# Patient Record
Sex: Male | Born: 1955
Health system: Southern US, Community
[De-identification: ages and names within clinical notes are randomized; demographics above are authoritative.]

## PROBLEM LIST (undated history)

## (undated) DIAGNOSIS — I10 Essential (primary) hypertension: Secondary | ICD-10-CM

## (undated) DIAGNOSIS — R51 Headache: Secondary | ICD-10-CM

## (undated) DIAGNOSIS — E7800 Pure hypercholesterolemia, unspecified: Secondary | ICD-10-CM

## (undated) DIAGNOSIS — E785 Hyperlipidemia, unspecified: Secondary | ICD-10-CM

## (undated) HISTORY — DX: Hyperlipidemia, unspecified: E78.5

## (undated) HISTORY — PX: COLONOSCOPY: SHX174

## (undated) HISTORY — DX: Essential (primary) hypertension: I10

---

## 2003-06-24 ENCOUNTER — Ambulatory Visit (HOSPITAL_COMMUNITY): Admission: RE | Admit: 2003-06-24 | Discharge: 2003-06-24 | Payer: Self-pay | Admitting: Family Medicine

## 2006-01-18 ENCOUNTER — Ambulatory Visit (HOSPITAL_COMMUNITY): Admission: RE | Admit: 2006-01-18 | Discharge: 2006-01-18 | Payer: Self-pay | Admitting: Family Medicine

## 2006-02-10 ENCOUNTER — Encounter (INDEPENDENT_AMBULATORY_CARE_PROVIDER_SITE_OTHER): Payer: Self-pay | Admitting: Specialist

## 2006-02-10 ENCOUNTER — Ambulatory Visit: Payer: Self-pay | Admitting: Internal Medicine

## 2006-02-10 ENCOUNTER — Ambulatory Visit (HOSPITAL_COMMUNITY): Admission: RE | Admit: 2006-02-10 | Discharge: 2006-02-10 | Payer: Self-pay | Admitting: Internal Medicine

## 2007-01-31 ENCOUNTER — Ambulatory Visit (HOSPITAL_COMMUNITY): Admission: RE | Admit: 2007-01-31 | Discharge: 2007-01-31 | Payer: Self-pay | Admitting: Family Medicine

## 2008-03-14 ENCOUNTER — Ambulatory Visit (HOSPITAL_COMMUNITY): Admission: RE | Admit: 2008-03-14 | Discharge: 2008-03-14 | Payer: Self-pay | Admitting: Family Medicine

## 2008-12-26 ENCOUNTER — Emergency Department (HOSPITAL_COMMUNITY): Admission: EM | Admit: 2008-12-26 | Discharge: 2008-12-26 | Payer: Self-pay | Admitting: Emergency Medicine

## 2009-07-21 ENCOUNTER — Ambulatory Visit (HOSPITAL_COMMUNITY): Admission: RE | Admit: 2009-07-21 | Discharge: 2009-07-21 | Payer: Self-pay | Admitting: Family Medicine

## 2010-08-17 ENCOUNTER — Ambulatory Visit (HOSPITAL_COMMUNITY)
Admission: RE | Admit: 2010-08-17 | Discharge: 2010-08-17 | Disposition: A | Discharge status: Home / Self Care | Payer: BC Managed Care – PPO | Source: Ambulatory Visit | Attending: Family Medicine | Admitting: Family Medicine

## 2010-08-17 ENCOUNTER — Other Ambulatory Visit (HOSPITAL_COMMUNITY): Payer: Self-pay | Admitting: Family Medicine

## 2010-08-17 DIAGNOSIS — F17200 Nicotine dependence, unspecified, uncomplicated: Secondary | ICD-10-CM

## 2010-08-17 DIAGNOSIS — R05 Cough: Secondary | ICD-10-CM

## 2010-08-17 DIAGNOSIS — R059 Cough, unspecified: Secondary | ICD-10-CM | POA: Insufficient documentation

## 2010-09-12 LAB — BASIC METABOLIC PANEL
BUN: 17 mg/dL (ref 6–23)
CO2: 26 mEq/L (ref 19–32)
Calcium: 9.3 mg/dL (ref 8.4–10.5)
Chloride: 109 mEq/L (ref 96–112)
Creatinine, Ser: 0.95 mg/dL (ref 0.4–1.5)
GFR calc Af Amer: >60 mL/min (ref >60)
GFR calc non Af Amer: >60 mL/min (ref >60)
Glucose, Bld: 145 mg/dL — ABNORMAL HIGH (ref 70–99)
Potassium: 3.5 mEq/L (ref 3.5–5.1)
Sodium: 140 mEq/L (ref 135–145)

## 2010-09-12 LAB — CBC
HCT: 42.1 % (ref 39.0–52.0)
Hemoglobin: 15.1 g/dL (ref 13.0–17.0)
MCHC: 35.8 g/dL (ref 30.0–36.0)
MCV: 93.2 fL (ref 78.0–100.0)
Platelets: 190 10*3/uL (ref 150–400)
RBC: 4.52 MIL/uL (ref 4.22–5.81)
RDW: 12.7 % (ref 11.5–15.5)
WBC: 9.6 10*3/uL (ref 4.0–10.5)

## 2010-09-12 LAB — POCT CARDIAC MARKERS
CKMB, poc: <1 ng/mL — ABNORMAL LOW (ref 1.0–8.0)
CKMB, poc: <1 ng/mL — ABNORMAL LOW (ref 1.0–8.0)
Myoglobin, poc: 55.1 ng/mL (ref 12–200)
Myoglobin, poc: 66.4 ng/mL (ref 12–200)
Troponin i, poc: <0.05 ng/mL (ref 0.00–0.09)
Troponin i, poc: <0.05 ng/mL (ref 0.00–0.09)

## 2010-09-12 LAB — DIFFERENTIAL
Basophils Absolute: 0 10*3/uL (ref 0.0–0.1)
Basophils Relative: 0 % (ref 0–1)
Eosinophils Absolute: 0.3 10*3/uL (ref 0.0–0.7)
Eosinophils Relative: 3 % (ref 0–5)
Lymphocytes Relative: 24 % (ref 12–46)
Lymphs Abs: 2.3 10*3/uL (ref 0.7–4.0)
Monocytes Absolute: 0.5 10*3/uL (ref 0.1–1.0)
Monocytes Relative: 6 % (ref 3–12)
Neutro Abs: 6.5 10*3/uL (ref 1.7–7.7)
Neutrophils Relative %: 68 % (ref 43–77)

## 2010-10-22 NOTE — Op Note (Signed)
NAME:  Brad Jackson, Brad Jackson               ACCOUNT NO.:  [id number]   MEDICAL RECORD NO.:  [id number]          PATIENT TYPE:  AMB   LOCATION:  DAY                           FACILITY:  [facility]   PHYSICIAN:  R. Roetta Sessions, M.D. DATE OF BIRTH:  [birth year]   DATE OF PROCEDURE:  02/10/2006  DATE OF DISCHARGE:                                 OPERATIVE REPORT   PROCEDURE:  Screening colonoscopy with biopsy.   INDICATIONS FOR PROCEDURE:  The patient is a 56 year old Caucasian male with  no lower GI tract symptoms sent over by the courtesy of Dr. Geanie Cooley for  colorectal cancer screening.  He has never had his lower GI tract imaged.  There is no family history of colorectal neoplasia.  Colonoscopy is now  being done as a screening maneuver.  This approach has been discussed with  the patient at length.  The potential risks, benefits, and alternatives have  been reviewed and questions answered.  He is agreeable.  Please see the  documentation in the medical record.   PROCEDURE:  O2 saturation, blood pressure, pulses, and respirations were  monitored throughout the entirety of the procedure.  Conscious sedation was  with Versed 4 mg IV, Demerol 75 mg IV in divided doses.  The instrument used  was the Olympus video chip system.   FINDINGS:  Digital rectal examination revealed no abnormalities.   ENDOSCOPIC FINDINGS:  The prep was excellent.   Rectum:  Examination of the rectal mucosa including retroflex view of the  anal verge revealed no abnormalities.   Colon:  The colonic mucosa was surveyed from the rectosigmoid junction  through the left, transverse, right colon to the area of the appendiceal  orifice, ileocecal valve, and cecum.  These structures were well-seen and  photographed for the record.  From this level, the scope was slowly  withdrawn.  All previously mentioned mucosal surfaces were again seen.  The  patient had a 4-mm polyp at 22 cm at the rectosigmoid.  The remainder of the  colonic mucosa appeared entirely normal.  The polyp was cold biopsy/removed.  The patient tolerated the procedure well and was reactive in endoscopy.   IMPRESSION:  1. Normal rectum.  2. Diminutive polyp at rectosigmoid, cold biopsy/removed.  3. The remainder of the colonic mucosa appeared normal.   RECOMMENDATIONS:  1. Follow up on pathology.  2. Further recommendations to follow.      [name], M.D.  Electronically Signed     RMR/MEDQ  D:  02/10/2006  T:  02/10/2006  Job:  [id number]   cc:   Corrie Mckusick, M.D.  Fax: [phone]

## 2011-07-12 ENCOUNTER — Other Ambulatory Visit (HOSPITAL_COMMUNITY): Payer: Self-pay | Admitting: Family Medicine

## 2011-07-12 ENCOUNTER — Ambulatory Visit (HOSPITAL_COMMUNITY)
Admission: RE | Admit: 2011-07-12 | Discharge: 2011-07-12 | Disposition: A | Discharge status: Home / Self Care | Payer: BC Managed Care – PPO | Source: Ambulatory Visit | Attending: Family Medicine | Admitting: Family Medicine

## 2011-07-12 DIAGNOSIS — Z0000 Encounter for general adult medical examination without abnormal findings: Secondary | ICD-10-CM

## 2011-07-12 DIAGNOSIS — E785 Hyperlipidemia, unspecified: Secondary | ICD-10-CM

## 2011-07-12 DIAGNOSIS — F17200 Nicotine dependence, unspecified, uncomplicated: Secondary | ICD-10-CM | POA: Insufficient documentation

## 2011-11-24 NOTE — H&P (Signed)
  NTS SOAP Note  Vital Signs:  Vitals as of: 11/24/2011: Systolic 159: Diastolic 89: Heart Rate 72: Temp 97.82F: Height 28ft 7in: Weight 172Lbs 0 Ounces: BMI 27  BMI : 26.94 kg/m2  Subjective: This 56 Years 55 Months old Male presents for an EGD.  Was admitted to [facility] several weeks ago with upper GI bleed.  Was taking both ASA and NSAIDS at that time.  Was treated for H. pylori positivity.  Has not had any episodes since that time.  Had a TCS within last ten years.  Denies any GI complaints at this time.   Review of Symptoms:  Constitutional:unremarkable   Head:unremarkable    Eyes:unremarkable   Nose/Mouth/Throat:unremarkable Cardiovascular:  unremarkable   Respiratory:unremarkable   Gastrointestinal:  unremarkable   Genitourinary:unremarkable     Musculoskeletal:unremarkable   Skin:unremarkable Hematolgic/Lymphatic:unremarkable     Allergic/Immunologic:unremarkable     Past Medical History:    Reviewed   Past Medical History  Surgical History: unremarkable Medical Problems: PUD, high cholesterol Allergies: nkda Medications: omeprazole, simvastatin, fish oil   Social History:Reviewed  Social History  Preferred Language: English ([place]) Race:  White Ethnicity: Not Hispanic / Latino Age: 56 Years 8 Months Marital Status:  S Alcohol: socially Recreational drug(s):  No   Smoking Status: Current every day smoker reviewed on 11/24/2011 Started Date: 06/06/1978 Packs per day: 1.00   Family History:  Reviewed   Family History  Is there a family history JX:[id number]    Objective Information: General:  Well appearing, well nourished in no distress. Throat:  no erythema, exudates or lesions. Neck:  Supple without lymphadenopathy.  Heart:  RRR, no murmur Lungs:    CTA bilaterally, no wheezes, rhonchi, rales.  Breathing unlabored. Abdomen:Soft, NT/ND, no HSM, no  masses.  Assessment:Peptic ulcer disease  Diagnosis &amp; Procedure: DiagnosisCode: 578.0, ProcedureCode: 08657,    Plan:Scheduled for EGD on 12/13/11.   Patient Education:Alternative treatments to surgery were discussed with patient (and family).  Risks and benefits  of procedure were fully explained to the patient (and family) who gave informed consent. Patient/family questions were addressed.  Follow-up:Pending Surgery

## 2011-12-07 ENCOUNTER — Encounter (HOSPITAL_COMMUNITY): Payer: Self-pay | Admitting: Pharmacy Technician

## 2011-12-19 MED ORDER — SODIUM CHLORIDE 0.45 % IV SOLN
Freq: Once | INTRAVENOUS | Status: AC
  Administered 2011-12-20: 07:00:00 via INTRAVENOUS

## 2011-12-20 ENCOUNTER — Ambulatory Visit (HOSPITAL_COMMUNITY)
Admission: RE | Admit: 2011-12-20 | Discharge: 2011-12-20 | Disposition: A | Discharge status: Home / Self Care | Payer: BC Managed Care – PPO | Source: Ambulatory Visit | Attending: General Surgery | Admitting: General Surgery

## 2011-12-20 ENCOUNTER — Encounter (HOSPITAL_COMMUNITY)
Admission: RE | Disposition: A | Discharge status: Home / Self Care | Payer: Self-pay | Source: Ambulatory Visit | Attending: General Surgery

## 2011-12-20 ENCOUNTER — Encounter (HOSPITAL_COMMUNITY): Payer: Self-pay | Admitting: *Deleted

## 2011-12-20 DIAGNOSIS — K2980 Duodenitis without bleeding: Secondary | ICD-10-CM | POA: Insufficient documentation

## 2011-12-20 DIAGNOSIS — F17200 Nicotine dependence, unspecified, uncomplicated: Secondary | ICD-10-CM | POA: Insufficient documentation

## 2011-12-20 DIAGNOSIS — K2960 Other gastritis without bleeding: Secondary | ICD-10-CM | POA: Insufficient documentation

## 2011-12-20 DIAGNOSIS — Z79899 Other long term (current) drug therapy: Secondary | ICD-10-CM | POA: Insufficient documentation

## 2011-12-20 DIAGNOSIS — K920 Hematemesis: Secondary | ICD-10-CM | POA: Insufficient documentation

## 2011-12-20 HISTORY — DX: Pure hypercholesterolemia, unspecified: E78.00

## 2011-12-20 HISTORY — PX: ESOPHAGOGASTRODUODENOSCOPY: SHX5428

## 2011-12-20 HISTORY — DX: Headache: R51

## 2011-12-20 SURGERY — EGD (ESOPHAGOGASTRODUODENOSCOPY)
Anesthesia: Moderate Sedation

## 2011-12-20 MED ORDER — MEPERIDINE HCL 50 MG/ML IJ SOLN
INTRAMUSCULAR | Status: AC
  Filled 2011-12-20: qty 1

## 2011-12-20 MED ORDER — MEPERIDINE HCL 25 MG/ML IJ SOLN
INTRAMUSCULAR | Status: DC | PRN
  Administered 2011-12-20: 50 mg via INTRAVENOUS

## 2011-12-20 MED ORDER — MIDAZOLAM HCL 5 MG/5ML IJ SOLN
INTRAMUSCULAR | Status: AC
  Filled 2011-12-20: qty 5

## 2011-12-20 MED ORDER — STERILE WATER FOR IRRIGATION IR SOLN
Status: DC | PRN
  Administered 2011-12-20: 09:00:00

## 2011-12-20 MED ORDER — BUTAMBEN-TETRACAINE-BENZOCAINE 2-2-14 % EX AERO
INHALATION_SPRAY | CUTANEOUS | Status: DC | PRN
  Administered 2011-12-20: 2 via TOPICAL

## 2011-12-20 MED ORDER — MIDAZOLAM HCL 5 MG/5ML IJ SOLN
INTRAMUSCULAR | Status: DC | PRN
  Administered 2011-12-20: 3 mg via INTRAVENOUS

## 2011-12-20 NOTE — Interval H&P Note (Signed)
History and Physical Interval Note:  12/20/2011 8:26 AM  Brad Jackson  has presented today for surgery, with the diagnosis of GERD gastroesophageal reflux disease  The various methods of treatment have been discussed with the patient and family. After consideration of risks, benefits and other options for treatment, the patient has consented to  Procedure(s) (LRB): ESOPHAGOGASTRODUODENOSCOPY (EGD) (N/A) as a surgical intervention .  The patient's history has been reviewed, patient examined, no change in status, stable for surgery.  I have reviewed the patients' chart and labs.  Questions were answered to the patient's satisfaction.     Franky Macho A

## 2011-12-20 NOTE — Op Note (Signed)
[facility] [address] [place], [place]  [address]  ENDOSCOPY PROCEDURE REPORT  PATIENT:  Brad, Jackson  MR#:  [id number] BIRTHDATE:  10-01-1955, 55 yrs. old  GENDER:  male  ENDOSCOPIST:  Franky Macho, MD Referred by:  Assunta Found, M.D.  PROCEDURE DATE:  12/20/2011 PROCEDURE:  EGD, diagnostic 43235 ASA CLASS:  Class II INDICATIONS:  hematemesis  MEDICATIONS:   Versed 3 mg IV, demerol 50 mg IV TOPICAL ANESTHETIC:  Cetacaine Spray  DESCRIPTION OF PROCEDURE:   After the risks benefits and alternatives of the procedure were thoroughly explained, informed consent was obtained.  The [id number] ([id number]) endoscope was introduced through the mouth and advanced to the second portion of the duodenum, without limitations.  The instrument was slowly withdrawn as the mucosa was fully examined. <<PROCEDUREIMAGES>>  Inflammation was found in the first portion of the duodenum (see image001). the pylorus was noted be widely patent. No frank ulcerations were seen in the duodenum or the antrum. Mild antritis was noted. The stomach was easily distensible. No hiatal hernia was seen. The Z line was noted to be at 40 cm from the teeth. The esophagus was normal limits. No evidence of Barrett's esophagitis. The scope was then withdrawn from the patient and the procedure completed.  COMPLICATIONS:  None  ENDOSCOPIC IMPRESSION: 1) Inflammation in the first portion duodenum 2) Duodenitis, bulb only RECOMMENDATIONS: the patient states he did not tolerate PPI treatment due to diarrhea. At this point, I think it is okay to leave him off of PPI. He was told to avoid NSAIDs. Should symptoms recur, I would restart a different PPI as able.  REPEAT EXAM:  No  ______________________________ Franky Macho, MD  CC:  Assunta Found, MD  n. Rosalie DoctorFranky Macho at 12/20/2011 08:42 AM  Glee Arvin, [phone]

## 2011-12-26 ENCOUNTER — Encounter (HOSPITAL_COMMUNITY): Payer: Self-pay | Admitting: General Surgery

## 2013-10-05 IMAGING — CR DG CHEST 2V
2 series · 2 of 2 positions shown · non-contrast
Comparison: 08/17/2010
COMPARISON: 08/17/2010

<!--  IDXRADR:ADDEND:BEGIN -->Addendum Begins
<!--  IDXRADR:ADDEND:INNER_BEGIN -->***ADDENDUM*** CREATED: 07/12/2011 [DATE]

Additional history provided:  Cough
CLINICAL DATA: Smoker
CHEST - 2 VIEW

[view not recorded (1 of 2)]
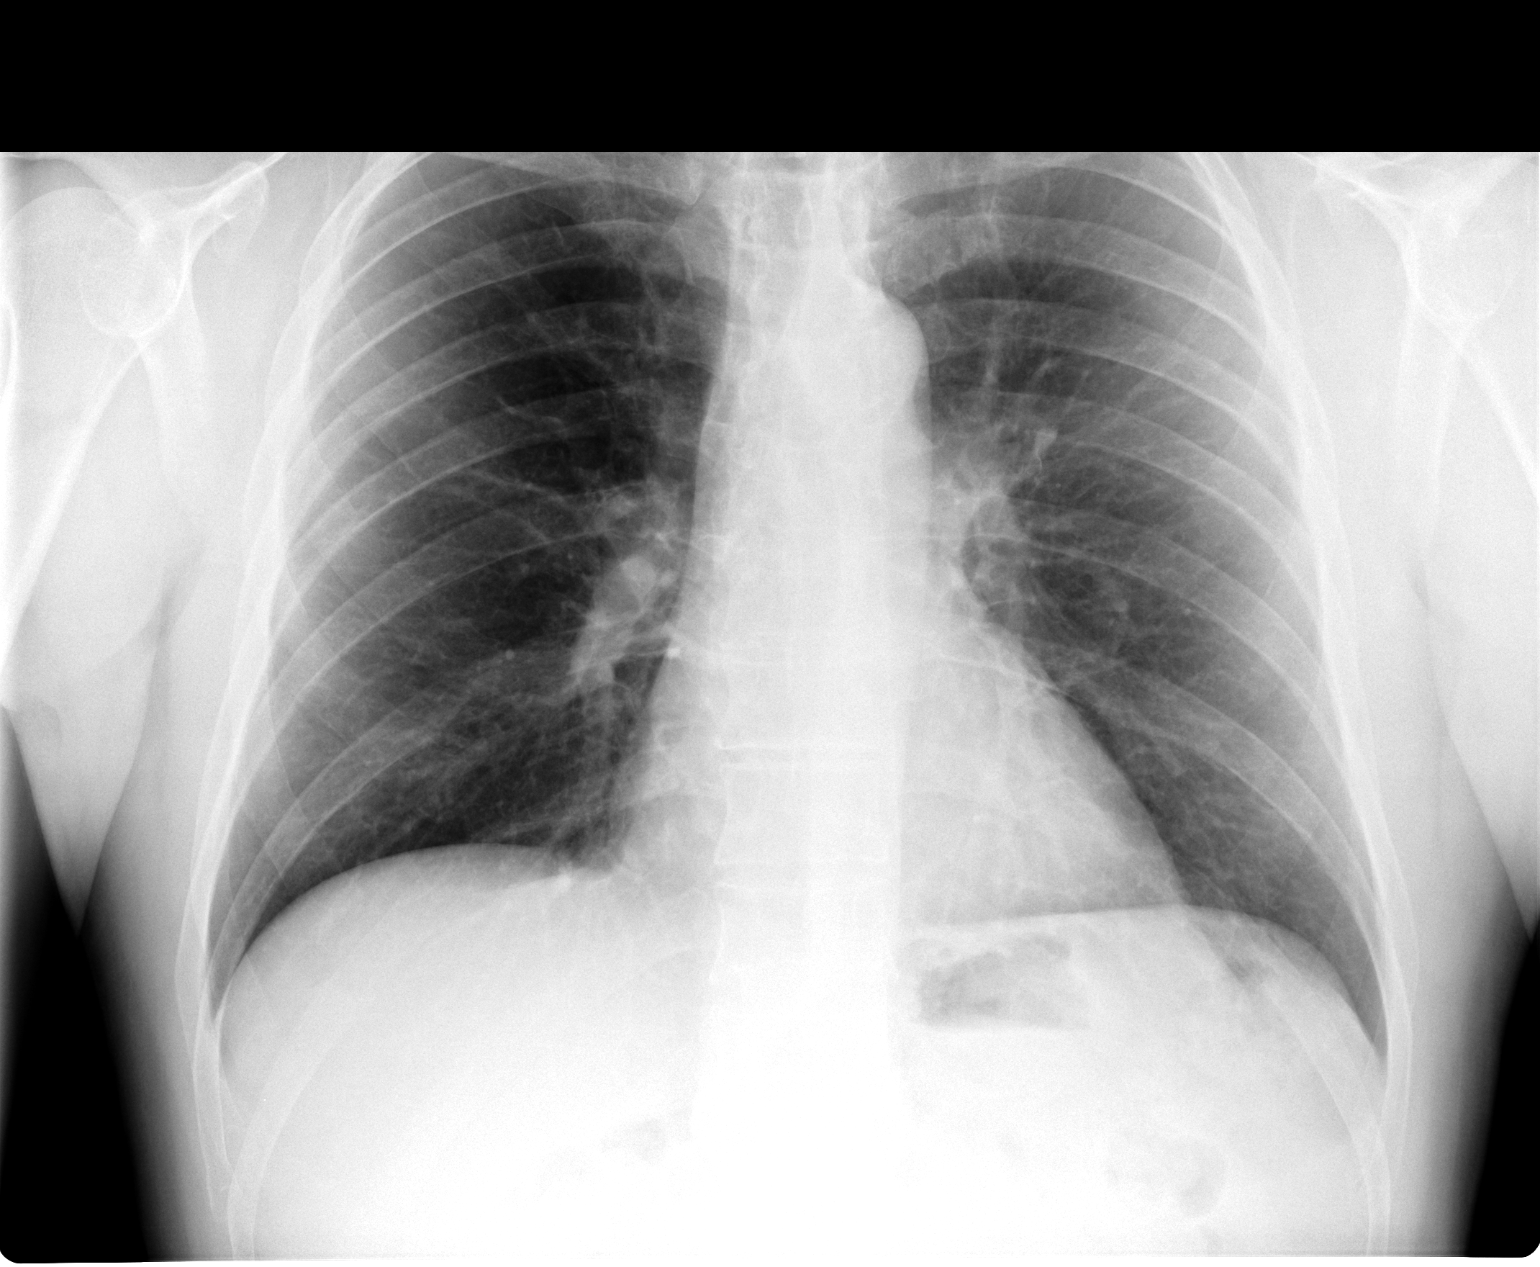

[view not recorded (2 of 2)]
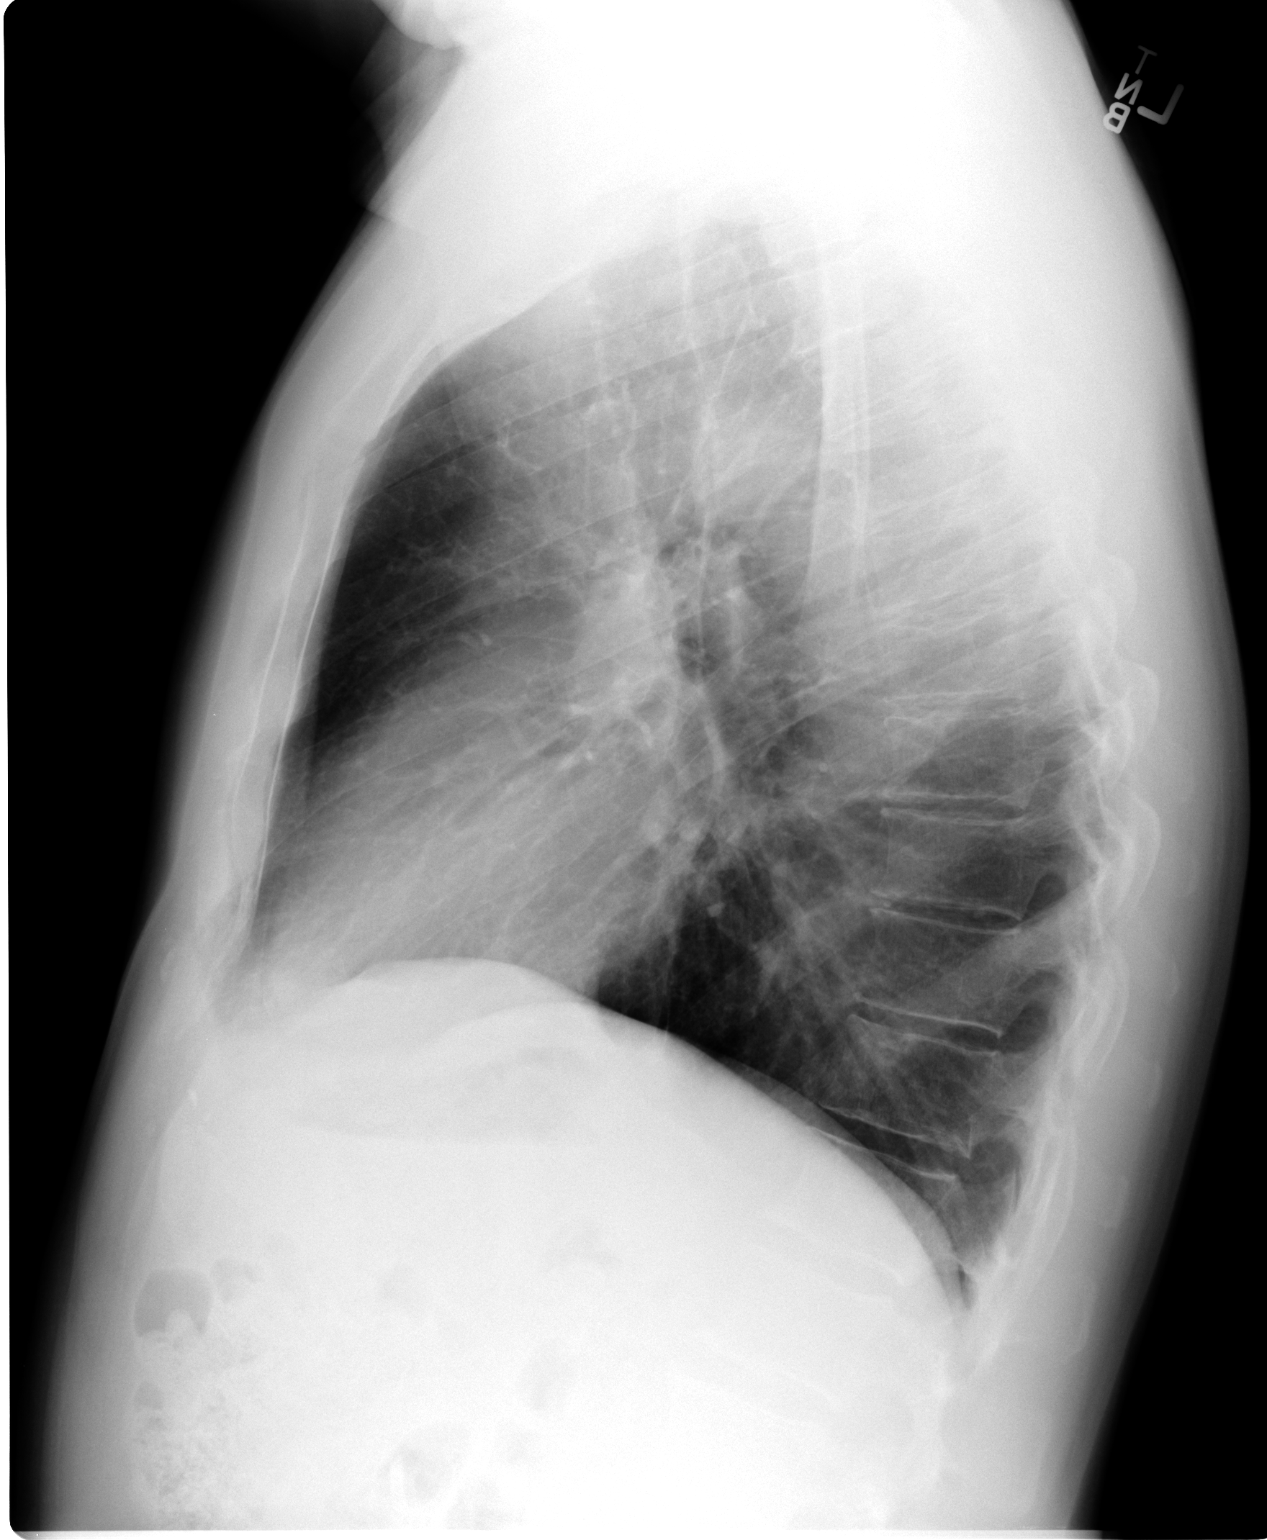

[2 of 2 positions shown; findings below may reference images not displayed]

FINDINGS: Normal heart size, mediastinal contours, and pulmonary vascularity.
Mild peribronchial thickening without infiltrate or effusion.
No pneumothorax.
Bones unremarkable.
IMPRESSION: Mild bronchitic changes.

<!--  IDXRADR:ADDEND:INNER_END -->Addendum Ends
<!--  IDXRADR:ADDEND:END -->*RADIOLOGY REPORT*
FINDINGS: Normal heart size, mediastinal contours, and pulmonary vascularity.
Mild peribronchial thickening without infiltrate or effusion.
No pneumothorax.
Bones unremarkable.
IMPRESSION: Mild bronchitic changes.

## 2014-09-11 ENCOUNTER — Ambulatory Visit (HOSPITAL_COMMUNITY)
Admission: RE | Admit: 2014-09-11 | Discharge: 2014-09-11 | Disposition: A | Discharge status: Home / Self Care | Payer: 59 | Source: Ambulatory Visit | Attending: Family Medicine | Admitting: Family Medicine

## 2014-09-11 ENCOUNTER — Other Ambulatory Visit (HOSPITAL_COMMUNITY): Payer: Self-pay | Admitting: Family Medicine

## 2014-09-11 DIAGNOSIS — J410 Simple chronic bronchitis: Secondary | ICD-10-CM

## 2014-09-11 DIAGNOSIS — F17200 Nicotine dependence, unspecified, uncomplicated: Secondary | ICD-10-CM

## 2014-09-11 DIAGNOSIS — Z0000 Encounter for general adult medical examination without abnormal findings: Secondary | ICD-10-CM

## 2014-09-11 DIAGNOSIS — E780 Pure hypercholesterolemia: Secondary | ICD-10-CM | POA: Insufficient documentation

## 2016-01-07 ENCOUNTER — Telehealth: Payer: Self-pay | Admitting: Internal Medicine

## 2016-01-07 NOTE — Telephone Encounter (Signed)
Recall for tcs °

## 2016-01-07 NOTE — Telephone Encounter (Signed)
Letter mailed to pt.  

## 2016-01-14 DIAGNOSIS — E663 Overweight: Secondary | ICD-10-CM | POA: Diagnosis not present

## 2016-01-14 DIAGNOSIS — Z719 Counseling, unspecified: Secondary | ICD-10-CM | POA: Diagnosis not present

## 2016-01-14 DIAGNOSIS — I10 Essential (primary) hypertension: Secondary | ICD-10-CM | POA: Diagnosis not present

## 2016-01-14 DIAGNOSIS — E782 Mixed hyperlipidemia: Secondary | ICD-10-CM | POA: Diagnosis not present

## 2016-01-14 DIAGNOSIS — Z6825 Body mass index (BMI) 25.0-25.9, adult: Secondary | ICD-10-CM | POA: Diagnosis not present

## 2016-01-14 DIAGNOSIS — Z0001 Encounter for general adult medical examination with abnormal findings: Secondary | ICD-10-CM | POA: Diagnosis not present

## 2016-01-14 DIAGNOSIS — Z1389 Encounter for screening for other disorder: Secondary | ICD-10-CM | POA: Diagnosis not present

## 2016-02-15 DIAGNOSIS — D72829 Elevated white blood cell count, unspecified: Secondary | ICD-10-CM | POA: Diagnosis not present

## 2016-02-15 DIAGNOSIS — Z1389 Encounter for screening for other disorder: Secondary | ICD-10-CM | POA: Diagnosis not present

## 2016-02-15 DIAGNOSIS — I10 Essential (primary) hypertension: Secondary | ICD-10-CM | POA: Diagnosis not present

## 2016-02-15 DIAGNOSIS — Z6825 Body mass index (BMI) 25.0-25.9, adult: Secondary | ICD-10-CM | POA: Diagnosis not present

## 2016-12-04 DIAGNOSIS — Z1211 Encounter for screening for malignant neoplasm of colon: Secondary | ICD-10-CM | POA: Diagnosis not present

## 2016-12-05 IMAGING — CR DG CHEST 2V
2 series · 2 of 2 positions shown · non-contrast
Comparison: July 12, 2011

CLINICAL DATA: Physical examination.  Hypercholesterolemia

EXAM:
CHEST  2 VIEW

[view not recorded (1 of 2)]
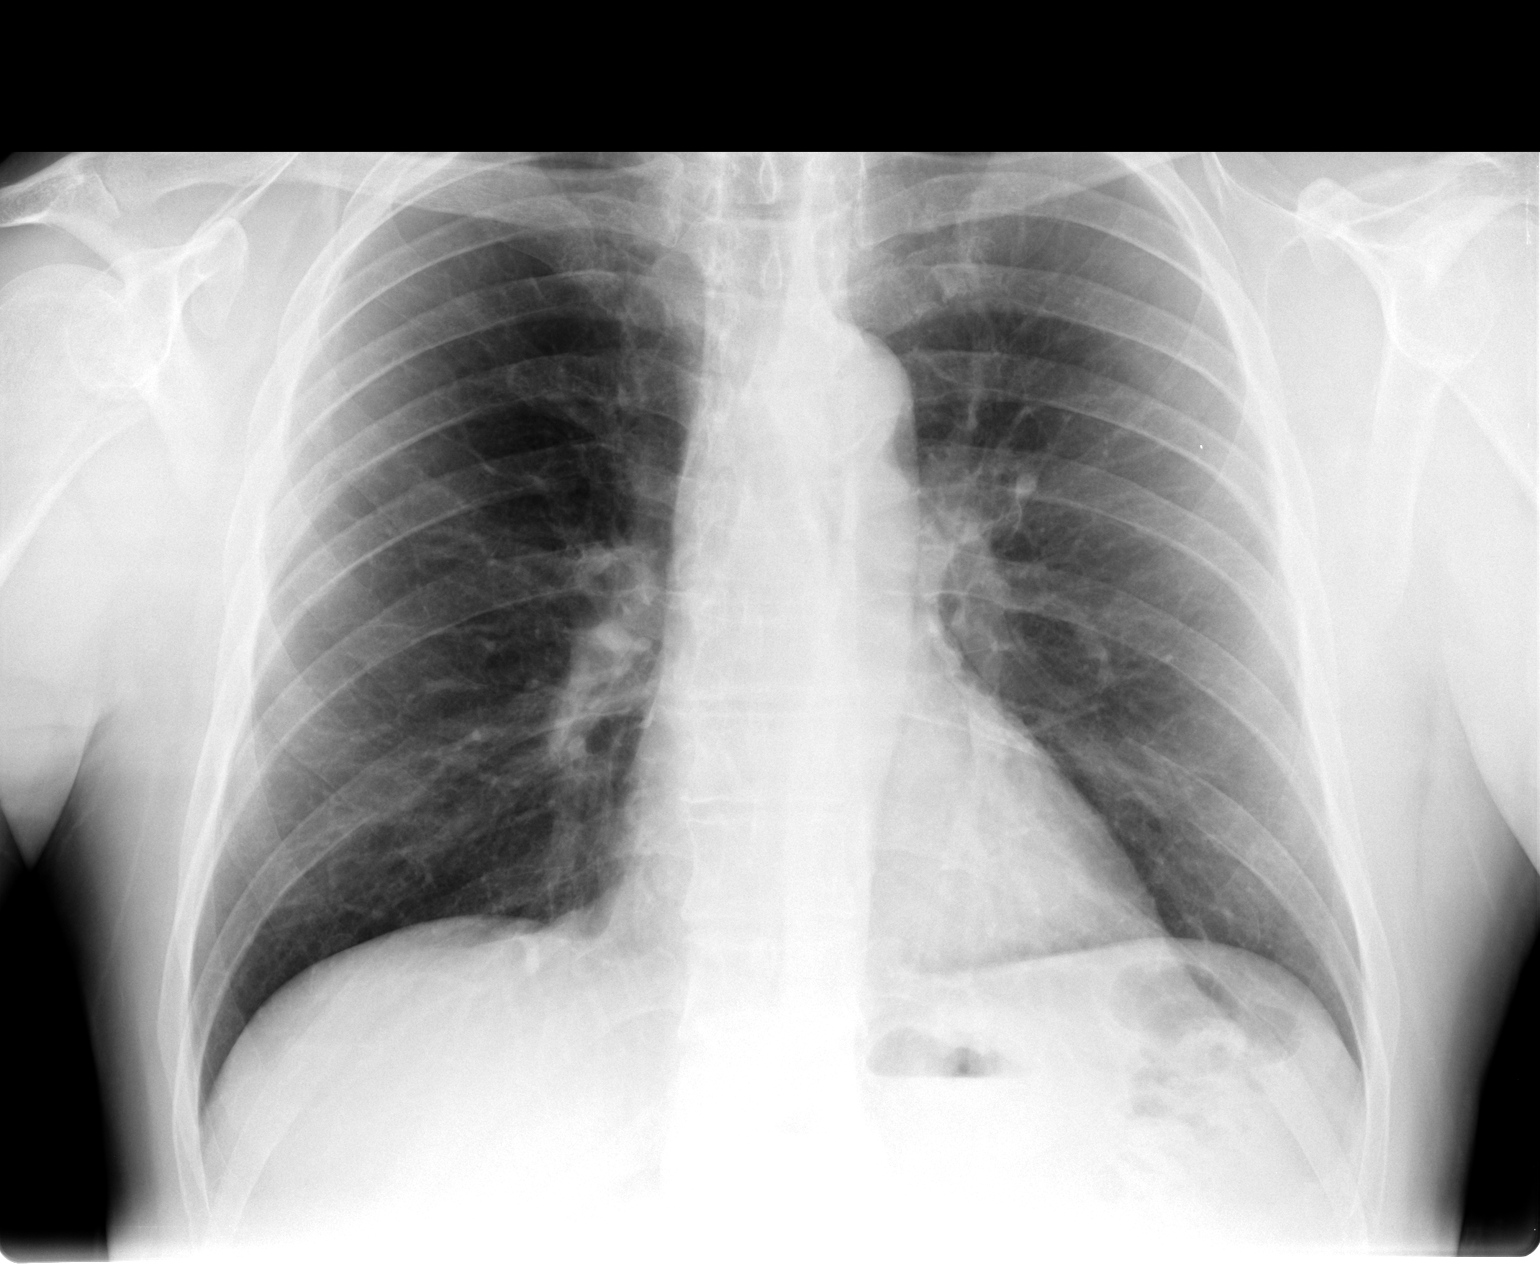

[view not recorded (2 of 2)]
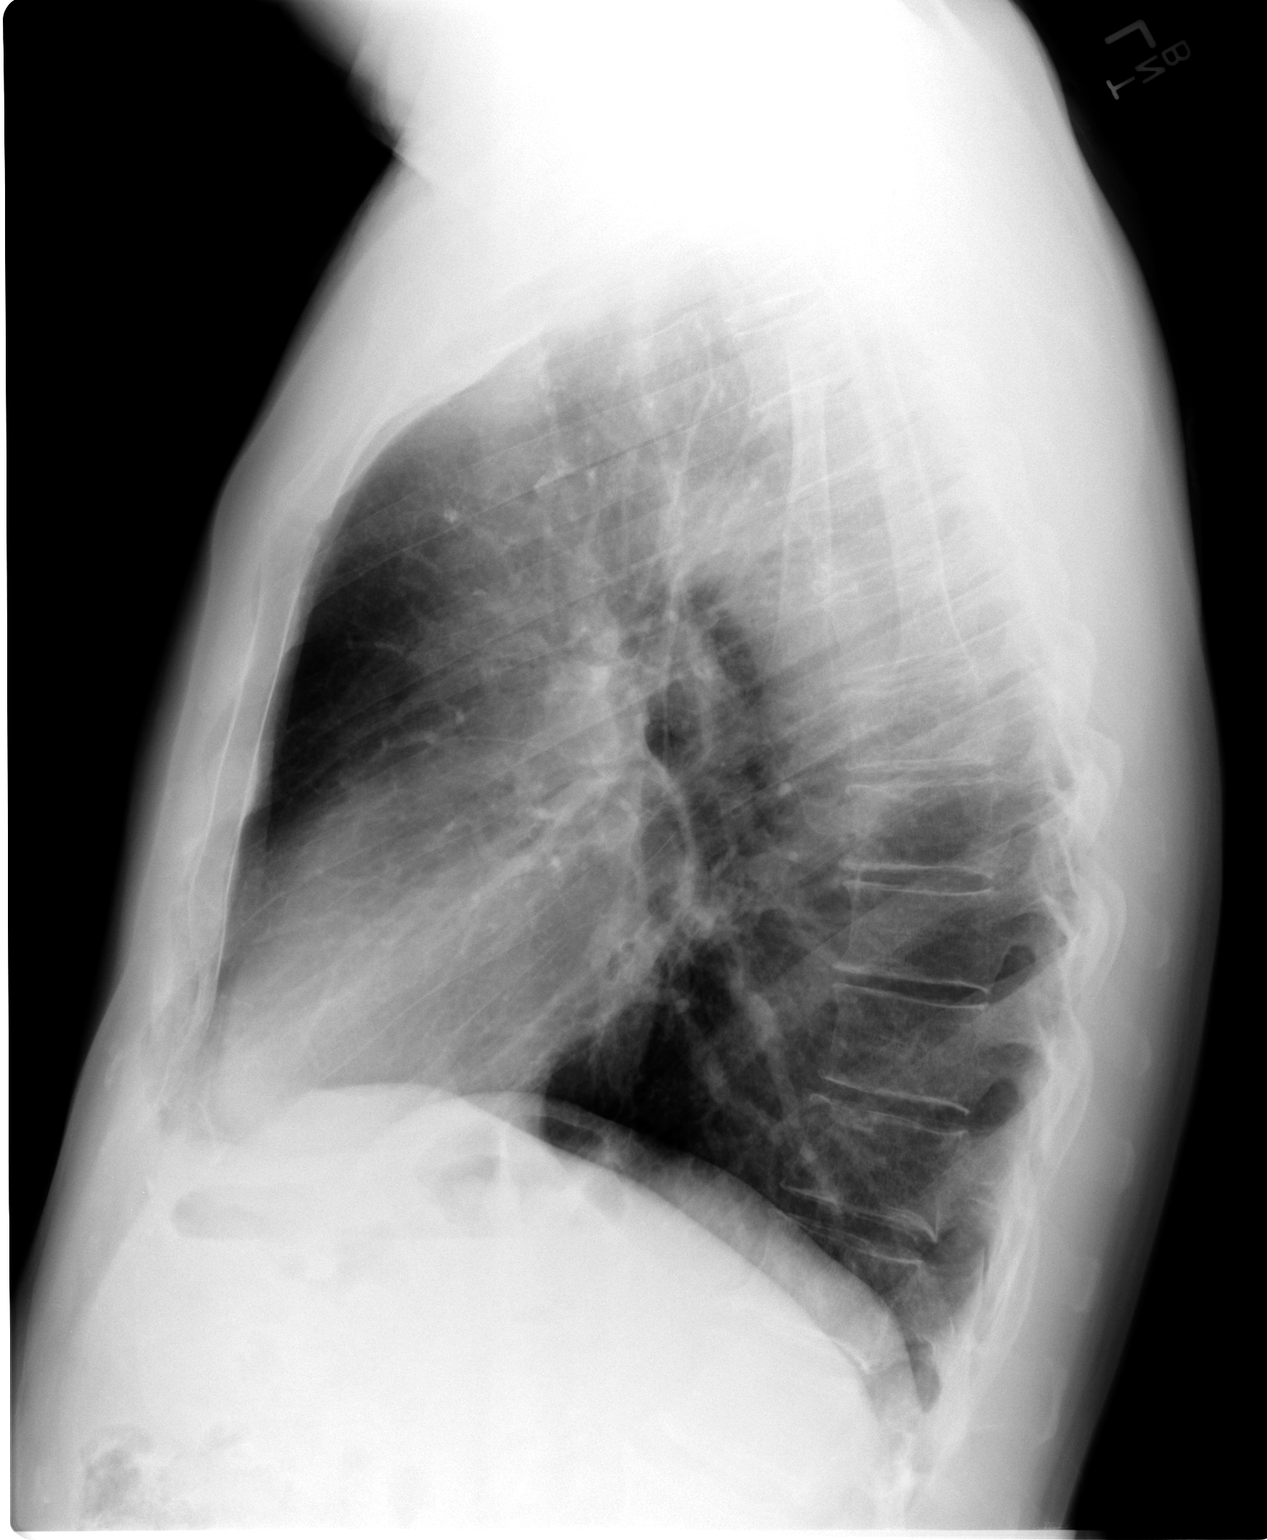

[2 of 2 positions shown; findings below may reference images not displayed]

FINDINGS: Lungs are clear. Heart size and pulmonary vascularity are normal. No
adenopathy. No bone lesions.
IMPRESSION: No edema or consolidation.

## 2017-03-23 DIAGNOSIS — Z1389 Encounter for screening for other disorder: Secondary | ICD-10-CM | POA: Diagnosis not present

## 2017-03-23 DIAGNOSIS — R030 Elevated blood-pressure reading, without diagnosis of hypertension: Secondary | ICD-10-CM | POA: Diagnosis not present

## 2017-03-23 DIAGNOSIS — Z6825 Body mass index (BMI) 25.0-25.9, adult: Secondary | ICD-10-CM | POA: Diagnosis not present

## 2017-03-23 DIAGNOSIS — E782 Mixed hyperlipidemia: Secondary | ICD-10-CM | POA: Diagnosis not present

## 2017-03-23 DIAGNOSIS — I10 Essential (primary) hypertension: Secondary | ICD-10-CM | POA: Diagnosis not present

## 2017-03-23 DIAGNOSIS — Z23 Encounter for immunization: Secondary | ICD-10-CM | POA: Diagnosis not present

## 2017-03-23 DIAGNOSIS — Z0000 Encounter for general adult medical examination without abnormal findings: Secondary | ICD-10-CM | POA: Diagnosis not present

## 2017-04-11 ENCOUNTER — Encounter: Payer: Self-pay | Admitting: General Practice

## 2017-04-18 ENCOUNTER — Telehealth: Payer: Self-pay

## 2017-04-18 NOTE — Telephone Encounter (Signed)
[phone] patient received letter to schedule tcs, no current gi or heart issues

## 2017-04-24 NOTE — Telephone Encounter (Signed)
LMOM to call.

## 2017-04-25 ENCOUNTER — Telehealth: Payer: Self-pay

## 2017-04-25 NOTE — Telephone Encounter (Signed)
Opened in error

## 2017-04-25 NOTE — Telephone Encounter (Signed)
See separate triage.  

## 2017-05-22 NOTE — Telephone Encounter (Addendum)
Gastroenterology Pre-Procedure Review  Request Date: 04/25/2017 Requesting Physician: Dr. Hilma Favors  PATIENT REVIEW QUESTIONS: The patient responded to the following health history questions as indicated:    Last colonoscopy was 02/10/2006 by Dr. Gala Romney  1. Diabetes Melitis: no 2. Joint replacements in the past 12 months: no 3. Major health problems in the past 3 months: no 4. Has an artificial valve or MVP: no 5. Has a defibrillator: no 6. Has been advised in past to take antibiotics in advance of a procedure like teeth cleaning: no 7. Family history of colon cancer: no  8. Alcohol Use: no 9. History of sleep apnea: no  10. History of coronary artery or other vascular stents placed within the last 12 months: no 11. History of any prior anesthesia complications: no    MEDICATIONS & ALLERGIES:    Patient reports the following regarding taking any blood thinners:   Plavix? no Aspirin? no Coumadin? no Brilinta? no Xarelto? no Eliquis? no Pradaxa? no Savaysa? no Effient? no  Patient confirms/reports the following medications:  Current Outpatient Medications  Medication Sig Dispense Refill  . fish oil-omega-3 fatty acids 1000 MG capsule Take 500 mg daily by mouth.     . losartan (COZAAR) 100 MG tablet Take 50 mg daily by mouth.    . Multiple Vitamins-Minerals (CENTRUM SILVER PO) Take 1 tablet by mouth daily.    . simvastatin (ZOCOR) 20 MG tablet Take 10 mg by mouth every evening.     No current facility-administered medications for this visit.     Patient confirms/reports the following allergies:  No Known Allergies  No orders of the defined types were placed in this encounter.   AUTHORIZATION INFORMATION Primary Insurance:   ID #:   Group #:  Pre-Cert / Auth required:  Pre-Cert / Auth #:   Secondary Insurance:   ID #:  Group #:  Pre-Cert / Auth required: Pre-Cert / Auth #:   SCHEDULE INFORMATION: Procedure has been scheduled as follows:  Date: 06/16/2017                 Time: 8:30 AM Location: [facility] Short Stay  This Gastroenterology Pre-Precedure Review Form is being routed to the following provider(s): R. Garfield Cornea, MD

## 2017-05-23 ENCOUNTER — Other Ambulatory Visit: Payer: Self-pay

## 2017-05-23 DIAGNOSIS — Z1211 Encounter for screening for malignant neoplasm of colon: Secondary | ICD-10-CM

## 2017-05-23 MED ORDER — PEG 3350-KCL-NA BICARB-NACL 420 G PO SOLR: 4000.0000 mL | ORAL | 0 refills | Status: DC

## 2017-05-23 NOTE — Telephone Encounter (Signed)
Ok to schedule.

## 2017-05-23 NOTE — Telephone Encounter (Signed)
Rx sent to the pharmacy and instructions mailed to pt.  

## 2017-06-16 ENCOUNTER — Encounter (HOSPITAL_COMMUNITY)
Admission: RE | Disposition: A | Discharge status: Home / Self Care | Payer: Self-pay | Source: Ambulatory Visit | Attending: Internal Medicine

## 2017-06-16 ENCOUNTER — Encounter (HOSPITAL_COMMUNITY): Payer: Self-pay | Admitting: *Deleted

## 2017-06-16 ENCOUNTER — Ambulatory Visit (HOSPITAL_COMMUNITY)
Admission: RE | Admit: 2017-06-16 | Discharge: 2017-06-16 | Disposition: A | Discharge status: Home / Self Care | Payer: BLUE CROSS/BLUE SHIELD | Source: Ambulatory Visit | Attending: Internal Medicine | Admitting: Internal Medicine

## 2017-06-16 DIAGNOSIS — Z79899 Other long term (current) drug therapy: Secondary | ICD-10-CM | POA: Insufficient documentation

## 2017-06-16 DIAGNOSIS — F17210 Nicotine dependence, cigarettes, uncomplicated: Secondary | ICD-10-CM | POA: Diagnosis not present

## 2017-06-16 DIAGNOSIS — Z1212 Encounter for screening for malignant neoplasm of rectum: Secondary | ICD-10-CM

## 2017-06-16 DIAGNOSIS — E7800 Pure hypercholesterolemia, unspecified: Secondary | ICD-10-CM | POA: Insufficient documentation

## 2017-06-16 DIAGNOSIS — I10 Essential (primary) hypertension: Secondary | ICD-10-CM | POA: Insufficient documentation

## 2017-06-16 DIAGNOSIS — Z1211 Encounter for screening for malignant neoplasm of colon: Secondary | ICD-10-CM | POA: Insufficient documentation

## 2017-06-16 DIAGNOSIS — D128 Benign neoplasm of rectum: Secondary | ICD-10-CM | POA: Diagnosis not present

## 2017-06-16 HISTORY — DX: Essential (primary) hypertension: I10

## 2017-06-16 HISTORY — PX: COLONOSCOPY: SHX5424

## 2017-06-16 HISTORY — PX: POLYPECTOMY: SHX5525

## 2017-06-16 SURGERY — COLONOSCOPY
Anesthesia: Moderate Sedation

## 2017-06-16 MED ORDER — SODIUM CHLORIDE 0.9 % IV SOLN
INTRAVENOUS | Status: DC
  Administered 2017-06-16: 1000 mL via INTRAVENOUS

## 2017-06-16 MED ORDER — STERILE WATER FOR IRRIGATION IR SOLN
Status: DC | PRN
  Administered 2017-06-16: 2.5 mL

## 2017-06-16 MED ORDER — MEPERIDINE HCL 100 MG/ML IJ SOLN
INTRAMUSCULAR | Status: DC | PRN
  Administered 2017-06-16 (×2): 50 mg via INTRAVENOUS
  Administered 2017-06-16: 25 mg via INTRAVENOUS

## 2017-06-16 MED ORDER — MIDAZOLAM HCL 5 MG/5ML IJ SOLN
INTRAMUSCULAR | Status: DC | PRN
  Administered 2017-06-16: 1 mg via INTRAVENOUS
  Administered 2017-06-16 (×2): 2 mg via INTRAVENOUS
  Administered 2017-06-16: 1 mg via INTRAVENOUS

## 2017-06-16 MED ORDER — ONDANSETRON HCL 4 MG/2ML IJ SOLN
INTRAMUSCULAR | Status: DC | PRN
  Administered 2017-06-16: 4 mg via INTRAVENOUS

## 2017-06-16 MED ORDER — ONDANSETRON HCL 4 MG/2ML IJ SOLN
INTRAMUSCULAR | Status: AC
  Filled 2017-06-16: qty 2

## 2017-06-16 MED ORDER — MIDAZOLAM HCL 5 MG/5ML IJ SOLN
INTRAMUSCULAR | Status: AC
  Filled 2017-06-16: qty 10

## 2017-06-16 MED ORDER — MEPERIDINE HCL 100 MG/ML IJ SOLN
INTRAMUSCULAR | Status: AC
  Filled 2017-06-16: qty 2

## 2017-06-16 NOTE — Op Note (Signed)
[facility] Patient Name: Brad Jackson Procedure Date: 06/16/2017 7:56 AM MRN: [id number] Date of Birth: August 09, 1955 Attending MD: Norvel Richards , MD CSN: [id number] Age: 62 Admit Type: Outpatient Procedure:                Colonoscopy Indications:              Screening for colorectal malignant neoplasm Providers:                Norvel Richards, MD, Lurline Del, RN, Aram Candela Referring MD:              Medicines:                Midazolam 6 mg IV, Meperidine 294 mg IV Complications:            No immediate complications. Estimated Blood Loss:     Estimated blood loss was minimal. Procedure:                Pre-Anesthesia Assessment:                           - Prior to the procedure, a History and Physical                            was performed, and patient medications and                            allergies were reviewed. The patient's tolerance of                            previous anesthesia was also reviewed. The risks                            and benefits of the procedure and the sedation                            options and risks were discussed with the patient.                            All questions were answered, and informed consent                            was obtained. Prior Anticoagulants: The patient has                            taken no previous anticoagulant or antiplatelet                            agents. ASA Grade Assessment: II - A patient with                            mild systemic disease. After reviewing the risks  and benefits, the patient was deemed in                            satisfactory condition to undergo the procedure.                           After obtaining informed consent, the colonoscope                            was passed under direct vision. Throughout the                            procedure, the patient's blood pressure, pulse, and      oxygen saturations were monitored continuously. The                            [id number] ([id number]) scope was introduced through                            the anus and advanced to the the cecum, identified                            by appendiceal orifice and ileocecal valve. The                            ileocecal valve, appendiceal orifice, and rectum                            were photographed. The entire colon was well                            visualized. The colonoscopy was performed without                            difficulty. The patient tolerated the procedure                            well. The colonoscopy was performed without                            difficulty. The quality of the bowel preparation                            was adequate. Scope In: 8:26:31 AM Scope Out: 8:40:22 AM Scope Withdrawal Time: 0 hours 8 minutes 43 seconds  Total Procedure Duration: 0 hours 13 minutes 51 seconds  Findings:      The perianal and digital rectal examinations were normal.      A 3 mm polyp was found in the rectum. The polyp was sessile. The polyp       was removed with a cold snare. Resection and retrieval were complete.       Estimated blood loss was minimal.      The exam was otherwise without abnormality on direct and retroflexion       views. Impression:               -  One 3 mm polyp in the rectum, removed with a cold                            snare. Resected and retrieved.                           - The examination was otherwise normal on direct                            and retroflexion views. Moderate Sedation:      Moderate (conscious) sedation was personally administered by an       anesthesia professional. The following parameters were monitored: oxygen       saturation, heart rate, blood pressure, respiratory rate, EKG, adequacy       of pulmonary ventilation, and response to care. Total physician       intraservice time was 29 minutes. Recommendation:            - Patient has a contact number available for                            emergencies. The signs and symptoms of potential                            delayed complications were discussed with the                            patient. Return to normal activities tomorrow.                            Written discharge instructions were provided to the                            patient.                           - Resume previous diet.                           - Continue present medications.                           - Repeat colonoscopy date to be determined after                            pending pathology results are reviewed for                            surveillance based on pathology results.                           - Return to GI clinic (date not yet determined). Procedure Code(s):        --- Professional ---                           [phone], Colonoscopy, flexible; with removal of  tumor(s), polyp(s), or other lesion(s) by snare                            technique Diagnosis Code(s):        --- Professional ---                           Z12.11, Encounter for screening for malignant                            neoplasm of colon                           K62.1, Rectal polyp CPT copyright 2016 American Medical Association. All rights reserved. The codes documented in this report are preliminary and upon coder review may  be revised to meet current compliance requirements. Cristopher Estimable. Romana Deaton, MD Norvel Richards, MD 06/16/2017 8:48:44 AM This report has been signed electronically. Number of Addenda: 0

## 2017-06-16 NOTE — H&P (Signed)
@LOGO @   Primary Care Physician:  Sharilyn Sites, MD Primary Gastroenterologist:  Dr. Gala Romney  Pre-Procedure History & Physical: HPI:  Brad Jackson is a 62 y.o. male is here for a screening colonoscopy.   Past Medical History:  Diagnosis Date  . Headache(784.0)   . Hypercholesteremia   . Hypertension     Past Surgical History:  Procedure Laterality Date  . COLONOSCOPY    . ESOPHAGOGASTRODUODENOSCOPY  12/20/2011   Procedure: ESOPHAGOGASTRODUODENOSCOPY (EGD);  Surgeon: Jamesetta So, MD;  Location: [facility] ENDO SUITE;  Service: Gastroenterology;  Laterality: N/A;    Prior to Admission medications   Medication Sig Start Date End Date Taking? Authorizing Provider  calcium carbonate (TUMS - DOSED IN MG ELEMENTAL CALCIUM) 500 MG chewable tablet Chew 2 tablets by mouth daily as needed for indigestion or heartburn.   Yes [provider]  ibuprofen (ADVIL,MOTRIN) 200 MG tablet Take 400 mg by mouth daily as needed for headache or moderate pain.   Yes [provider]  losartan (COZAAR) 100 MG tablet Take 50 mg daily by mouth.   Yes [provider]  Multiple Vitamins-Minerals (CENTRUM SILVER PO) Take 1 tablet by mouth daily.   Yes [provider]  Omega-3 Fatty Acids (FISH OIL) 500 MG CAPS Take 500 mg by mouth daily.   Yes [provider]  polyethylene glycol-electrolytes (TRILYTE) 420 g solution Take 4,000 mLs by mouth as directed. 05/23/17  Yes Carlis Stable, NP  simvastatin (ZOCOR) 20 MG tablet Take 10 mg by mouth every evening.   Yes [provider]    Allergies as of 05/23/2017  . (No Known Allergies)    Family History  Problem Relation Age of Onset  . Aneurysm Brother   . Colon cancer Neg Hx     Social History   Socioeconomic History  . Marital status: Married    Spouse name: Not on file  . Number of children: Not on file  . Years of education: Not on file  . Highest education level: Not on file  Social Needs  . Financial  resource strain: Not on file  . Food insecurity - worry: Not on file  . Food insecurity - inability: Not on file  . Transportation needs - medical: Not on file  . Transportation needs - non-medical: Not on file  Occupational History  . Not on file  Tobacco Use  . Smoking status: Current Every Day Smoker    Packs/day: 1.00    Years: 40.00    Pack years: 40.00  . Smokeless tobacco: Never Used  Substance and Sexual Activity  . Alcohol use: Yes    Comment: very rarely  . Drug use: No  . Sexual activity: Not on file  Other Topics Concern  . Not on file  Social History Narrative  . Not on file    Review of Systems: See HPI, otherwise negative ROS  Physical Exam: BP (!) 144/88   Pulse 98   Temp 98.3 F (36.8 C) (Oral)   Resp 17   Ht 5\' 7"  (1.702 m)   Wt 170 lb (77.1 kg)   SpO2 98%   BMI 26.63 kg/m  General:   Alert,  Well-developed, well-nourished, pleasant and cooperative in NAD Lungs:  Clear throughout to auscultation.   No wheezes, crackles, or rhonchi. No acute distress. Heart:  Regular rate and rhythm; no murmurs, clicks, rubs,  or gallops. Abdomen:  Soft, nontender and nondistended. No masses, hepatosplenomegaly or hernias noted. Normal bowel sounds, without  guarding, and without rebound.    Impression/Plan: Brad Jackson is now here to undergo a screening colonoscopy.  Negative colonoscopy 2007.  I have offered the patient a screening colonoscopy today.  The risks, benefits, limitations, alternatives and imponderables have been reviewed with the patient. Questions have been answered. All parties are agreeable.   Risks, benefits, limitations, imponderables and alternatives regarding colonoscopy have been reviewed with the patient. Questions have been answered. All parties agreeable.     Notice:  This dictation was prepared with Dragon dictation along with smaller phrase technology. Any transcriptional errors that result from this process are unintentional and may  not be corrected upon review.

## 2017-06-16 NOTE — Discharge Instructions (Signed)
°Colonoscopy °Discharge Instructions ° °Read the instructions outlined below and refer to this sheet in the next few weeks. These discharge instructions provide you with general information on caring for yourself after you leave the hospital. Your doctor may also give you specific instructions. While your treatment has been planned according to the most current medical practices available, unavoidable complications occasionally occur. If you have any problems or questions after discharge, call Dr. Rourk at [phone]. °ACTIVITY °· You may resume your regular activity, but move at a slower pace for the next 24 hours.  °· Take frequent rest periods for the next 24 hours.  °· Walking will help get rid of the air and reduce the bloated feeling in your belly (abdomen).  °· No driving for 24 hours (because of the medicine (anesthesia) used during the test).   °· Do not sign any important legal documents or operate any machinery for 24 hours (because of the anesthesia used during the test).  °NUTRITION °· Drink plenty of fluids.  °· You may resume your normal diet as instructed by your doctor.  °· Begin with a light meal and progress to your normal diet. Heavy or fried foods are harder to digest and may make you feel sick to your stomach (nauseated).  °· Avoid alcoholic beverages for 24 hours or as instructed.  °MEDICATIONS °· You may resume your normal medications unless your doctor tells you otherwise.  °WHAT YOU CAN EXPECT TODAY °· Some feelings of bloating in the abdomen.  °· Passage of more gas than usual.  °· Spotting of blood in your stool or on the toilet paper.  °IF YOU HAD POLYPS REMOVED DURING THE COLONOSCOPY: °· No aspirin products for 7 days or as instructed.  °· No alcohol for 7 days or as instructed.  °· Eat a soft diet for the next 24 hours.  °FINDING OUT THE RESULTS OF YOUR TEST °Not all test results are available during your visit. If your test results are not back during the visit, make an appointment  with your caregiver to find out the results. Do not assume everything is normal if you have not heard from your caregiver or the medical facility. It is important for you to follow up on all of your test results.  °SEEK IMMEDIATE MEDICAL ATTENTION IF: °· You have more than a spotting of blood in your stool.  °· Your belly is swollen (abdominal distention).  °· You are nauseated or vomiting.  °· You have a temperature over 101.  °· You have abdominal pain or discomfort that is severe or gets worse throughout the day.  ° ° °Polyp information provided ° °Further recommendations to follow pending review of pathology report ° ° °Colon Polyps °Polyps are tissue growths inside the body. Polyps can grow in many places, including the large intestine (colon). A polyp may be a round bump or a mushroom-shaped growth. You could have one polyp or several. °Most colon polyps are noncancerous (benign). However, some colon polyps can become cancerous over time. °What are the causes? °The exact cause of colon polyps is not known. °What increases the risk? °This condition is more likely to develop in people who: °· Have a family history of colon cancer or colon polyps. °· Are older than 50 or older than 45 if they are African American. °· Have inflammatory bowel disease, such as ulcerative colitis or Crohn disease. °· Are overweight. °· Smoke cigarettes. °· Do not get enough exercise. °· Drink too much alcohol. °·   exercise. °· Drink too much alcohol. °· Eat a diet that is: °? High in fat and red meat. °? Low in fiber. °· Had childhood cancer that was treated with abdominal radiation. ° °What are the signs or symptoms? °Most polyps do not cause symptoms. If you have symptoms, they may include: °· Blood coming from your rectum when having a bowel movement. °· Blood in your stool. The stool may look dark red or black. °· A change in bowel habits, such as constipation or diarrhea. ° °How is this diagnosed? °This condition is diagnosed with a colonoscopy. This  is a procedure that uses a lighted, flexible scope to look at the inside of your colon. °How is this treated? °Treatment for this condition involves removing any polyps that are found. Those polyps will then be tested for cancer. If cancer is found, your health care provider will talk to you about options for colon cancer treatment. °Follow these instructions at home: °Diet °· Eat plenty of fiber, such as fruits, vegetables, and whole grains. °· Eat foods that are high in calcium and vitamin D, such as milk, cheese, yogurt, eggs, liver, fish, and broccoli. °· Limit foods high in fat, red meats, and processed meats, such as hot dogs, sausage, bacon, and lunch meats. °· Maintain a healthy weight, or lose weight if recommended by your health care provider. °General instructions °· Do not smoke cigarettes. °· Do not drink alcohol excessively. °· Keep all follow-up visits as told by your health care provider. This is important. This includes keeping regularly scheduled colonoscopies. Talk to your health care provider about when you need a colonoscopy. °· Exercise every day or as told by your health care provider. °Contact a health care provider if: °· You have new or worsening bleeding during a bowel movement. °· You have new or increased blood in your stool. °· You have a change in bowel habits. °· You unexpectedly lose weight. °This information is not intended to replace advice given to you by your health care provider. Make sure you discuss any questions you have with your health care provider. °Document Released: 02/17/2004 Document Revised: 10/29/2015 Document Reviewed: 04/13/2015 °Elsevier Interactive Patient Education © 2018 [employer] ° °

## 2017-06-19 ENCOUNTER — Encounter: Payer: Self-pay | Admitting: Internal Medicine

## 2017-06-20 ENCOUNTER — Encounter (HOSPITAL_COMMUNITY): Payer: Self-pay | Admitting: Internal Medicine

## 2018-03-26 DIAGNOSIS — I10 Essential (primary) hypertension: Secondary | ICD-10-CM | POA: Diagnosis not present

## 2018-03-26 DIAGNOSIS — F17200 Nicotine dependence, unspecified, uncomplicated: Secondary | ICD-10-CM | POA: Diagnosis not present

## 2018-03-26 DIAGNOSIS — R030 Elevated blood-pressure reading, without diagnosis of hypertension: Secondary | ICD-10-CM | POA: Diagnosis not present

## 2018-03-26 DIAGNOSIS — Z1389 Encounter for screening for other disorder: Secondary | ICD-10-CM | POA: Diagnosis not present

## 2018-03-26 DIAGNOSIS — E782 Mixed hyperlipidemia: Secondary | ICD-10-CM | POA: Diagnosis not present

## 2018-03-26 DIAGNOSIS — Z0001 Encounter for general adult medical examination with abnormal findings: Secondary | ICD-10-CM | POA: Diagnosis not present

## 2018-03-26 DIAGNOSIS — Z719 Counseling, unspecified: Secondary | ICD-10-CM | POA: Diagnosis not present

## 2018-03-26 DIAGNOSIS — E663 Overweight: Secondary | ICD-10-CM | POA: Diagnosis not present

## 2018-03-26 DIAGNOSIS — Z6825 Body mass index (BMI) 25.0-25.9, adult: Secondary | ICD-10-CM | POA: Diagnosis not present

## 2018-03-26 DIAGNOSIS — Z23 Encounter for immunization: Secondary | ICD-10-CM | POA: Diagnosis not present

## 2020-03-04 DIAGNOSIS — Z23 Encounter for immunization: Secondary | ICD-10-CM | POA: Diagnosis not present

## 2020-03-04 DIAGNOSIS — Z1331 Encounter for screening for depression: Secondary | ICD-10-CM | POA: Diagnosis not present

## 2020-03-04 DIAGNOSIS — F17200 Nicotine dependence, unspecified, uncomplicated: Secondary | ICD-10-CM | POA: Diagnosis not present

## 2020-03-04 DIAGNOSIS — E782 Mixed hyperlipidemia: Secondary | ICD-10-CM | POA: Diagnosis not present

## 2020-03-04 DIAGNOSIS — R000 Tachycardia, unspecified: Secondary | ICD-10-CM | POA: Diagnosis not present

## 2020-03-04 DIAGNOSIS — Z0001 Encounter for general adult medical examination with abnormal findings: Secondary | ICD-10-CM | POA: Diagnosis not present

## 2020-03-04 DIAGNOSIS — Z1389 Encounter for screening for other disorder: Secondary | ICD-10-CM | POA: Diagnosis not present

## 2020-03-04 DIAGNOSIS — Z6824 Body mass index (BMI) 24.0-24.9, adult: Secondary | ICD-10-CM | POA: Diagnosis not present

## 2020-05-13 ENCOUNTER — Ambulatory Visit (INDEPENDENT_AMBULATORY_CARE_PROVIDER_SITE_OTHER): Payer: Medicare Other | Admitting: Cardiology

## 2020-05-13 ENCOUNTER — Encounter: Payer: Self-pay | Admitting: Cardiology

## 2020-05-13 ENCOUNTER — Encounter: Payer: Self-pay | Admitting: *Deleted

## 2020-05-13 VITALS — BP 146/80 | HR 88 | Ht 66.0 in | Wt 173.0 lb

## 2020-05-13 DIAGNOSIS — R0602 Shortness of breath: Secondary | ICD-10-CM

## 2020-05-13 DIAGNOSIS — Z720 Tobacco use: Secondary | ICD-10-CM | POA: Diagnosis not present

## 2020-05-13 DIAGNOSIS — I10 Essential (primary) hypertension: Secondary | ICD-10-CM | POA: Diagnosis not present

## 2020-05-13 DIAGNOSIS — R9431 Abnormal electrocardiogram [ECG] [EKG]: Secondary | ICD-10-CM | POA: Diagnosis not present

## 2020-05-13 NOTE — Patient Instructions (Signed)
Your physician recommends that you schedule a follow-up appointment in: PENDING  Your physician recommends that you continue on your current medications as directed. Please refer to the Current Medication list given to you today.  Your physician has requested that you have an echocardiogram. Echocardiography is a painless test that uses sound waves to create images of your heart. It provides your doctor with information about the size and shape of your heart and how well your heart's chambers and valves are working. This procedure takes approximately one hour. There are no restrictions for this procedure.  Your physician has requested that you have en exercise stress myoview. For further information please visit [email or web address]. Please follow instruction sheet, as given.  Thank you for choosing [facility]!!

## 2020-05-13 NOTE — Progress Notes (Signed)
Cardiology Office Note  Date: 05/13/2020   ID: Brad Jackson, DOB [birth year], MRN [id number]  PCP:  Sharilyn Sites, MD  Cardiologist:  Rozann Lesches, MD Electrophysiologist:  None   Chief Complaint  Patient presents with  . Referred with abnormal ECG    History of Present Illness: Brad Jackson is a 64 y.o. male referred for cardiology consultation by Dr. Hilma Favors presumably for evaluation of abnormal ECG, limited information was provided.  He tells me that he generally feels well.  He is a longtime smoker, states that he gets short of breath with activity and feels that it is related to this.  No wheezing or coughing.  He does not describe any exertional chest pain or tightness, reports having some gas symptoms in his chest a few months ago.  I did review his tracing from September as noted below.  There is no old tracing available today for comparison, we are checking with his PCP office to see if there are any old tracings.  I talked with him about the abnormalities and potential implications.  His follow-up ECG today shows only nonspecific T wave abnormalities, significant improvement from the prior tracing.  He has a history of hypertension and hyperlipidemia, currently on Benicar.  He had previously been on Zocor.  His most recent LDL was 134.  Past Medical History:  Diagnosis Date  . Essential hypertension   . Headache(784.0)   . Hyperlipidemia     Past Surgical History:  Procedure Laterality Date  . COLONOSCOPY    . COLONOSCOPY N/A 06/16/2017   Procedure: COLONOSCOPY;  Surgeon: Daneil Dolin, MD;  [facility];  Service: Endoscopy;  Laterality: N/A;  8:30 AM  . ESOPHAGOGASTRODUODENOSCOPY  12/20/2011   Procedure: ESOPHAGOGASTRODUODENOSCOPY (EGD);  Surgeon: Jamesetta So, MD;  [facility];  Service: Gastroenterology;  Laterality: N/A;  . POLYPECTOMY  06/16/2017   Procedure: POLYPECTOMY;  Surgeon: Daneil Dolin, MD;  [facility];   Service: Endoscopy;;  rectal    Current Outpatient Medications  Medication Sig Dispense Refill  . calcium carbonate (TUMS - DOSED IN MG ELEMENTAL CALCIUM) 500 MG chewable tablet Chew 2 tablets by mouth daily as needed for indigestion or heartburn.    Marland Kitchen ibuprofen (ADVIL,MOTRIN) 200 MG tablet Take 400 mg by mouth daily as needed for headache or moderate pain.    Marland Kitchen olmesartan (BENICAR) 40 MG tablet Take 40 mg by mouth daily.     No current facility-administered medications for this visit.   Allergies:  Patient has no known allergies.   Social History: The patient  reports that he has been smoking cigarettes. He has a 40.00 pack-year smoking history. He has never used smokeless tobacco. He reports current alcohol use. He reports that he does not use drugs.   Family History: The patient's family history includes Aneurysm in his brother; CAD in his mother; Kidney disease in his father.   ROS:   No palpitations or syncope.  Physical Exam: VS:  BP (!) 146/80   Pulse 88   Ht 5\' 6"  (1.676 m)   Wt 173 lb (78.5 kg)   SpO2 97%   BMI 27.92 kg/m , BMI Body mass index is 27.92 kg/m.  Wt Readings from Last 3 Encounters:  05/13/20 173 lb (78.5 kg)  06/16/17 170 lb (77.1 kg)  12/20/11 170 lb (77.1 kg)    General: Patient appears comfortable at rest. HEENT: Conjunctiva and lids normal, wearing a mask. Neck: Supple, no  elevated JVP or carotid bruits, no thyromegaly. Lungs: Clear to auscultation, nonlabored breathing at rest. Cardiac: Regular rate and rhythm, no S3 or significant systolic murmur, no pericardial rub. Abdomen: Soft, bowel sounds present. Extremities: No pitting edema, distal pulses 2+. Skin: Warm and dry. Musculoskeletal: No kyphosis. Neuropsychiatric: Alert and oriented x3, affect grossly appropriate.  ECG:  An ECG dated September 2021 was personally reviewed today and demonstrated:  Sinus rhythm with deep symmetrical anterolateral T wave inversions including high lateral  leads.  Recent Labwork:  September 2021: Hemoglobin 15.6, platelets 257, BUN 15, creatinine 1.19, potassium 4.4, AST 13, ALT 11, cholesterol 191, triglycerides 146, HDL 30, LDL 134  Other Studies Reviewed Today:  No prior cardiac testing for review.  Assessment and Plan:  65.  64 year old male with a history of hypertension and hyperlipidemia.  ECG obtained during routine physical back in September per PCP was significantly abnormal as outlined above.  Follow-up tracing today shows improvement in T wave abnormalities.  He reports dyspnea on exertion, no chest pain, was having some gas symptoms a few months ago.  He is a longtime smoker.  He has not undergone any prior cardiac structural or ischemic testing and we will arrange an echocardiogram as well as an exercise Myoview for further evaluation.  2.  Essential hypertension, currently on Benicar.  He follows with Dr. Hilma Favors.  3.  History of hyperlipidemia, previously on Zocor.  Last LDL was 134.  4.  Longstanding tobacco abuse.  I talked with him about smoking cessation, he is trying to cut back.  Medication Adjustments/Labs and Tests Ordered: Current medicines are reviewed at length with the patient today.  Concerns regarding medicines are outlined above.   Tests Ordered: Orders Placed This Encounter  Procedures  . NM Myocar Multi W/Spect W/Wall Motion / EF  . EKG 12-Lead  . ECHOCARDIOGRAM COMPLETE     Medication Changes: No orders of the defined types were placed in this encounter.   Disposition:  Follow up test results and determine next step.  Signed, Satira Sark, MD, [facility] 05/13/2020 9:45 AM    [facility] [place], [place], [place] [address] Phone: [phone]; Fax: [phone]

## 2020-05-21 ENCOUNTER — Other Ambulatory Visit (HOSPITAL_COMMUNITY): Payer: Medicare Other

## 2020-05-21 ENCOUNTER — Encounter (HOSPITAL_COMMUNITY): Payer: Medicare Other

## 2020-05-21 ENCOUNTER — Ambulatory Visit (HOSPITAL_COMMUNITY): Payer: Medicare Other

## 2020-06-12 ENCOUNTER — Encounter (HOSPITAL_COMMUNITY): Payer: Self-pay

## 2020-06-12 ENCOUNTER — Other Ambulatory Visit: Payer: Self-pay

## 2020-06-12 ENCOUNTER — Ambulatory Visit (HOSPITAL_BASED_OUTPATIENT_CLINIC_OR_DEPARTMENT_OTHER)
Admission: RE | Admit: 2020-06-12 | Discharge: 2020-06-12 | Disposition: A | Discharge status: Home / Self Care | Payer: Medicare Other | Source: Ambulatory Visit | Attending: Cardiology | Admitting: Cardiology

## 2020-06-12 ENCOUNTER — Ambulatory Visit (HOSPITAL_COMMUNITY)
Admission: RE | Admit: 2020-06-12 | Discharge: 2020-06-12 | Disposition: A | Discharge status: Home / Self Care | Payer: Medicare Other | Source: Ambulatory Visit | Attending: Cardiology | Admitting: Cardiology

## 2020-06-12 DIAGNOSIS — R9431 Abnormal electrocardiogram [ECG] [EKG]: Secondary | ICD-10-CM | POA: Insufficient documentation

## 2020-06-12 DIAGNOSIS — R0602 Shortness of breath: Secondary | ICD-10-CM | POA: Insufficient documentation

## 2020-06-12 LAB — NM MYOCAR MULTI W/SPECT W/WALL MOTION / EF
LV dias vol: 81 mL (ref 62–150)
LV sys vol: 32 mL
Peak HR: 116 {beats}/min
RATE: 0.35
Rest HR: 68 {beats}/min
SDS: 4
SRS: 0
SSS: 4
TID: 1.14

## 2020-06-12 LAB — ECHOCARDIOGRAM COMPLETE
Area-P 1/2: 3.01 cm2
P 1/2 time: 399 msec
S' Lateral: 2.3 cm

## 2020-06-12 MED ORDER — TECHNETIUM TC 99M TETROFOSMIN IV KIT
10.0000 | PACK | Freq: Once | INTRAVENOUS | Status: AC | PRN
  Administered 2020-06-12: 10.3 via INTRAVENOUS

## 2020-06-12 MED ORDER — SODIUM CHLORIDE FLUSH 0.9 % IV SOLN
INTRAVENOUS | Status: AC
  Administered 2020-06-12: 10 mL via INTRAVENOUS
  Filled 2020-06-12: qty 10

## 2020-06-12 MED ORDER — TECHNETIUM TC 99M TETROFOSMIN IV KIT
30.0000 | PACK | Freq: Once | INTRAVENOUS | Status: AC | PRN
  Administered 2020-06-12: 31.6 via INTRAVENOUS

## 2020-06-12 MED ORDER — REGADENOSON 0.4 MG/5ML IV SOLN
INTRAVENOUS | Status: AC
  Administered 2020-06-12: 0.4 mg via INTRAVENOUS
  Filled 2020-06-12: qty 5

## 2020-06-12 NOTE — Progress Notes (Signed)
I.V. discontinued. Site was clean, dry and intact. Pressure dressing applied.   Alvino Chapel, RCS

## 2020-06-12 NOTE — Progress Notes (Signed)
*  PRELIMINARY RESULTS* Echocardiogram 2D Echocardiogram has been performed.  Brad Jackson 06/12/2020, 10:36 AM

## 2020-06-16 ENCOUNTER — Telehealth: Payer: Self-pay | Admitting: *Deleted

## 2020-06-16 MED ORDER — ROSUVASTATIN CALCIUM 10 MG PO TABS: 10.0000 mg | ORAL_TABLET | Freq: Every day | ORAL | 3 refills | Status: DC

## 2020-06-16 MED ORDER — ASPIRIN EC 81 MG PO TBEC: 81.0000 mg | DELAYED_RELEASE_TABLET | Freq: Every day | ORAL | 3 refills | Status: DC

## 2020-06-16 NOTE — Telephone Encounter (Signed)
Patient informed and verbalized understanding of plan. Copy sent to PCP 

## 2020-06-16 NOTE — Telephone Encounter (Signed)
-----   Message from Satira Sark, MD sent at 06/14/2020  9:37 AM EST ----- Results reviewed.  Myoview is suggestive of underlying CAD and potentially a small infarct scar with mild peri-infarct ischemia.  As per recent office note, not reporting any progressive exertional symptoms at this time with stable shortness of breath and no chest pain.  He is already on Benicar.  Suggest adding aspirin 81 mg daily, would also start Crestor 10 mg daily to get LDL down below 70.  Schedule an office follow-up in about 3 months, presuming no progressive symptoms develop in the interim.  Not clear that we necessarily need to pursue a cardiac catheterization at this point.

## 2020-09-28 ENCOUNTER — Other Ambulatory Visit: Payer: Self-pay

## 2020-09-28 ENCOUNTER — Encounter: Payer: Self-pay | Admitting: Cardiology

## 2020-09-28 ENCOUNTER — Ambulatory Visit (INDEPENDENT_AMBULATORY_CARE_PROVIDER_SITE_OTHER): Payer: Medicare Other | Admitting: Cardiology

## 2020-09-28 VITALS — BP 130/76 | HR 78 | Ht 68.0 in | Wt 174.6 lb

## 2020-09-28 DIAGNOSIS — I10 Essential (primary) hypertension: Secondary | ICD-10-CM

## 2020-09-28 DIAGNOSIS — R9439 Abnormal result of other cardiovascular function study: Secondary | ICD-10-CM

## 2020-09-28 DIAGNOSIS — Z789 Other specified health status: Secondary | ICD-10-CM | POA: Diagnosis not present

## 2020-09-28 DIAGNOSIS — E782 Mixed hyperlipidemia: Secondary | ICD-10-CM

## 2020-09-28 NOTE — Patient Instructions (Signed)
Medication Instructions:  None today *If you need a refill on your cardiac medications before your next appointment, please call your pharmacy*   Lab Work: None today If you have labs (blood work) drawn today and your tests are completely normal, you will receive your results only by: Marland Kitchen MyChart Message (if you have MyChart) OR . A paper copy in the mail If you have any lab test that is abnormal or we need to change your treatment, we will call you to review the results.   Testing/Procedures: None today   Follow-Up: At [facility], you and your health needs are our priority.  As part of our continuing mission to provide you with exceptional heart care, we have created designated Provider Care Teams.  These Care Teams include your primary Cardiologist (physician) and Advanced Practice Providers (APPs -  Physician Assistants and Nurse Practitioners) who all work together to provide you with the care you need, when you need it.  We recommend signing up for the patient portal called "MyChart".  Sign up information is provided on this After Visit Summary.  MyChart is used to connect with patients for Virtual Visits (Telemedicine).  Patients are able to view lab/test results, encounter notes, upcoming appointments, etc.  Non-urgent messages can be sent to your provider as well.   To learn more about what you can do with MyChart, go to [email or web address].    Your next appointment:   12 month(s)  The format for your next appointment:   In Person  Provider:   Rozann Lesches, MD   Other Instructions None

## 2020-09-28 NOTE — Progress Notes (Signed)
Cardiology Office Note  Date: 09/28/2020   ID: KOLBY SCHARA, DOB 12/03/1955, MRN [id number]  PCP:  Sharilyn Sites, MD  Cardiologist:  Rozann Lesches, MD Electrophysiologist:  None   Chief Complaint  Patient presents with  . Cardiac follow-up    History of Present Illness: Brad Jackson is a 65 y.o. male seen in consultation in December 2021.  He was referred for a Lexiscan Myoview at that time.  Study was suggestive of underlying CAD with potentially small infarct scar and mild peri-infarct ischemia.  Medical therapy was recommended and modifications were made at that point.  He presents for follow-up.  He tells me that overall he has been doing very well, no exertional symptomatology including chest pain or breathlessness.  Unfortunately, he did not tolerate aspirin due to significant stomach upset.  Also states that he felt poorly on Crestor, interestingly had coughing and shortness of breath that resolved when he stopped the medication.  He recalls having trouble with other statins in the past.  He is more focused now on diet, not interested in trying a different lipid-lowering medication at this point.   Past Medical History:  Diagnosis Date  . Essential hypertension   . Headache(784.0)   . Hyperlipidemia     Past Surgical History:  Procedure Laterality Date  . COLONOSCOPY    . COLONOSCOPY N/A 06/16/2017   Procedure: COLONOSCOPY;  Surgeon: Daneil Dolin, MD;  [facility];  Service: Endoscopy;  Laterality: N/A;  8:30 AM  . ESOPHAGOGASTRODUODENOSCOPY  12/20/2011   Procedure: ESOPHAGOGASTRODUODENOSCOPY (EGD);  Surgeon: Jamesetta So, MD;  [facility];  Service: Gastroenterology;  Laterality: N/A;  . POLYPECTOMY  06/16/2017   Procedure: POLYPECTOMY;  Surgeon: Daneil Dolin, MD;  [facility];  Service: Endoscopy;;  rectal    Current Outpatient Medications  Medication Sig Dispense Refill  . calcium carbonate (TUMS - DOSED IN MG  ELEMENTAL CALCIUM) 500 MG chewable tablet Chew 2 tablets by mouth daily as needed for indigestion or heartburn.    Marland Kitchen ibuprofen (ADVIL,MOTRIN) 200 MG tablet Take 400 mg by mouth daily as needed for headache or moderate pain.    Marland Kitchen olmesartan (BENICAR) 40 MG tablet Take 40 mg by mouth daily.     No current facility-administered medications for this visit.   Allergies:  Patient has no known allergies.   ROS: No palpitations or syncope.  Physical Exam: VS:  BP 130/76   Pulse 78   Ht 5\' 8"  (1.727 m)   Wt 174 lb 9.6 oz (79.2 kg)   SpO2 98%   BMI 26.55 kg/m , BMI Body mass index is 26.55 kg/m.  Wt Readings from Last 3 Encounters:  09/28/20 174 lb 9.6 oz (79.2 kg)  05/13/20 173 lb (78.5 kg)  06/16/17 170 lb (77.1 kg)    General: Patient appears comfortable at rest. HEENT: Conjunctiva and lids normal, wearing a mask. Neck: Supple, no elevated JVP or carotid bruits, no thyromegaly. Lungs: Clear to auscultation, nonlabored breathing at rest. Cardiac: Regular rate and rhythm, no S3 or significant systolic murmur, no pericardial rub. Extremities: No pitting edema.   ECG:  An ECG dated 05/13/2020 was personally reviewed today and demonstrated:  Sinus rhythm.  Recent Labwork:  September 2021: Hemoglobin 15.6, platelets 257, BUN 15, creatinine 1.19, potassium 4.4, AST 13, ALT 11, cholesterol 191, triglycerides 146, HDL 30, LDL 134  Other Studies Reviewed Today:  Lexiscan Myoview 06/12/2020:  There was no ST segment deviation  noted during stress.  Findings consistent with small prior distal anterolateral myocardial infarction with mild peri-infarct ischemia.  This is a low risk study.  The left ventricular ejection fraction is normal (55-65%).  Assessment and Plan:  1.  Ischemic heart disease based on abnormal Lexiscan Myoview as noted above.  Possible small distal anterolateral infarct scar with mild peri-infarct ischemia, although no active angina at this time.  As discussed above,  he tolerated neither aspirin nor Crestor.  At this point he does not want to try different type of lipid-lowering agent.  He plans to focus on diet and exercise.  2.  Essential hypertension, on Benicar, tolerating.  Systolic 342 today.  No changes were made.  3.  Mixed hyperlipidemia with statin intolerance.  Problems with both Zocor and Crestor, possibly another agent as well.  Could consider Zetia in addition to diet.  He is not interested in PCSK9 inhibitor.  Medication Adjustments/Labs and Tests Ordered: Current medicines are reviewed at length with the patient today.  Concerns regarding medicines are outlined above.   Tests Ordered: No orders of the defined types were placed in this encounter.   Medication Changes: No orders of the defined types were placed in this encounter.   Disposition:  Follow up 1 year.  Signed, Satira Sark, MD, [facility] 09/28/2020 2:15 PM    [facility] at [facility] [address], [place], [place] [address] Phone: [phone]; Fax: [phone]

## 2021-03-24 ENCOUNTER — Other Ambulatory Visit: Payer: Self-pay | Admitting: Family Medicine

## 2021-03-24 ENCOUNTER — Other Ambulatory Visit (HOSPITAL_COMMUNITY): Payer: Self-pay | Admitting: Family Medicine

## 2024-03-27 ENCOUNTER — Other Ambulatory Visit (HOSPITAL_COMMUNITY): Payer: Self-pay | Admitting: Family Medicine

## 2024-03-27 DIAGNOSIS — I714 Abdominal aortic aneurysm, without rupture, unspecified: Secondary | ICD-10-CM

## 2024-04-04 ENCOUNTER — Ambulatory Visit (HOSPITAL_COMMUNITY)

## 2024-04-09 ENCOUNTER — Ambulatory Visit (HOSPITAL_COMMUNITY)
Admission: RE | Admit: 2024-04-09 | Discharge: 2024-04-09 | Disposition: A | Discharge status: Home / Self Care | Source: Ambulatory Visit | Attending: Family Medicine | Admitting: Family Medicine

## 2024-04-09 DIAGNOSIS — I714 Abdominal aortic aneurysm, without rupture, unspecified: Secondary | ICD-10-CM | POA: Diagnosis present

## 2024-05-07 ENCOUNTER — Encounter (INDEPENDENT_AMBULATORY_CARE_PROVIDER_SITE_OTHER): Payer: Self-pay | Admitting: *Deleted
